# Patient Record
Sex: Male | Born: 1980 | Race: Black or African American | Hispanic: No | Marital: Single | Smoking: Current every day smoker
Health system: Southern US, Community
[De-identification: ages and names within clinical notes are randomized; demographics above are authoritative.]

## PROBLEM LIST (undated history)

## (undated) DIAGNOSIS — J189 Pneumonia, unspecified organism: Secondary | ICD-10-CM

## (undated) DIAGNOSIS — J45909 Unspecified asthma, uncomplicated: Secondary | ICD-10-CM

---
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
## 2012-03-05 ENCOUNTER — Emergency Department (HOSPITAL_COMMUNITY): Payer: Self-pay

## 2012-03-05 ENCOUNTER — Encounter (HOSPITAL_COMMUNITY): Payer: Self-pay | Admitting: *Deleted

## 2012-03-05 ENCOUNTER — Emergency Department (HOSPITAL_COMMUNITY)
Admission: EM | Admit: 2012-03-05 | Discharge: 2012-03-05 | Disposition: A | Discharge status: Home / Self Care | Payer: Self-pay | Attending: Emergency Medicine | Admitting: Emergency Medicine

## 2012-03-05 DIAGNOSIS — F17200 Nicotine dependence, unspecified, uncomplicated: Secondary | ICD-10-CM | POA: Insufficient documentation

## 2012-03-05 DIAGNOSIS — R05 Cough: Secondary | ICD-10-CM | POA: Insufficient documentation

## 2012-03-05 DIAGNOSIS — R059 Cough, unspecified: Secondary | ICD-10-CM | POA: Insufficient documentation

## 2012-03-05 DIAGNOSIS — Z8701 Personal history of pneumonia (recurrent): Secondary | ICD-10-CM | POA: Insufficient documentation

## 2012-03-05 DIAGNOSIS — R51 Headache: Secondary | ICD-10-CM | POA: Insufficient documentation

## 2012-03-05 DIAGNOSIS — J329 Chronic sinusitis, unspecified: Secondary | ICD-10-CM | POA: Insufficient documentation

## 2012-03-05 DIAGNOSIS — J3489 Other specified disorders of nose and nasal sinuses: Secondary | ICD-10-CM | POA: Insufficient documentation

## 2012-03-05 HISTORY — DX: Pneumonia, unspecified organism: J18.9

## 2012-03-05 LAB — RAPID STREP SCREEN (MED CTR MEBANE ONLY): Streptococcus, Group A Screen (Direct): NEGATIVE

## 2012-03-05 MED ORDER — DEXTROMETHORPHAN HBR 15 MG/5ML PO SYRP: 10.0000 mL | ORAL_SOLUTION | Freq: Four times a day (QID) | ORAL | Status: AC | PRN

## 2012-03-05 MED ORDER — AZITHROMYCIN 250 MG PO TABS: 250.0000 mg | ORAL_TABLET | Freq: Every day | ORAL | Status: DC

## 2012-03-05 NOTE — ED Notes (Signed)
Pt reports sorethroat and headache too

## 2012-03-05 NOTE — ED Notes (Signed)
Pt is here with temp of 103 and took tylenol 2 hours ago.  Pt with cough.  No vomiting or diarrhea

## 2012-03-05 NOTE — ED Provider Notes (Signed)
History     CSN: [id number]  Arrival date & time 03/05/12  1335   First MD Initiated Contact with Patient 03/05/12 1347      Chief Complaint  Patient presents with  . Fever  . Cough  . Headache    (Consider location/radiation/quality/duration/timing/severity/associated sxs/prior treatment) HPI Comments: Patient is a 32 year old male who presents with a 3 day history of multiple symptoms including cough, fever of 103, and body aches. Symptoms started gradually and progressively worsened since the onset. Symptoms are constant. Patient tried tylenol for symptoms with some relief of fever. No other associated symptoms. No known sick contacts. No aggravating/alleviating factors.    Past Medical History  Diagnosis Date  . Pneumonia     History reviewed. No pertinent past surgical history.  No family history on file.  History  Substance Use Topics  . Smoking status: Current Every Day Smoker  . Smokeless tobacco: Not on file  . Alcohol Use: Yes      Review of Systems  HENT: Positive for congestion.   Respiratory: Positive for cough.   All other systems reviewed and are negative.    Allergies  Review of patient's allergies indicates no known allergies.  Home Medications  No current outpatient prescriptions on file.  BP 124/63  Pulse 106  Temp 100.8 F (38.2 C) (Oral)  Resp 20  Physical Exam  Nursing note and vitals reviewed. Constitutional: He is oriented to person, place, and time. He appears well-developed and well-nourished. No distress.       Patient coughing throughout interview.   HENT:  Head: Normocephalic and atraumatic.  Mouth/Throat: Oropharynx is clear and moist. No oropharyngeal exudate.       Maxillary sinus tenderness to palpation.   Eyes: Conjunctivae normal are normal.  Neck: Normal range of motion.  Cardiovascular: Normal rate and regular rhythm.  Exam reveals no gallop and no friction rub.   No murmur heard. Pulmonary/Chest: Effort normal  and breath sounds normal. He has no wheezes. He has no rales. He exhibits no tenderness.  Abdominal: Soft. There is no tenderness.  Musculoskeletal: Normal range of motion.  Neurological: He is alert and oriented to person, place, and time.       Speech is goal-oriented. Moves limbs without ataxia.   Skin: Skin is warm and dry.  Psychiatric: He has a normal mood and affect. His behavior is normal.    ED Course  Procedures (including critical care time)   Labs Reviewed  RAPID STREP SCREEN   Dg Chest 2 View  03/05/2012  *RADIOLOGY REPORT*  Clinical Data: Fever.  Cough.  Headache.  Smoker.  CHEST - 2 VIEW  Comparison: None.  Findings: Minimal pectus excavatum deformity. Midline trachea. Normal heart size and mediastinal contours.  Apparent vague right perihilar increased density is favored to be secondary to a pectus deformity.  Clear lungs.  IMPRESSION: No acute cardiopulmonary disease.   Original Report Authenticated By: Jeronimo Greaves, M.D.      1. Sinusitis       MDM  2:08 PM Chest xray pending. Patient will have dextromethorphan for cough.   2:46 PM Chest xray negative and strep test negative. Patient will be treated as sinusitis. No further evaluation needed at this time.       Emilia Beck, [place] 03/06/12 [phone]

## 2012-03-05 NOTE — ED Notes (Signed)
Pt having intermittent coughing, mild sore throat since last night. Pt states tylenol has made pt feel better. denies n/v

## 2012-03-07 NOTE — ED Provider Notes (Signed)
Medical screening examination/treatment/procedure(s) were performed by non-physician practitioner and as supervising physician I was immediately available for consultation/collaboration.   Ayla Dunigan W Kenniyah Sasaki, MD 03/07/12 0102 

## 2013-04-22 ENCOUNTER — Other Ambulatory Visit: Payer: Self-pay | Admitting: Occupational Medicine

## 2013-04-22 ENCOUNTER — Ambulatory Visit: Payer: Self-pay

## 2013-04-22 DIAGNOSIS — Z0000 Encounter for general adult medical examination without abnormal findings: Secondary | ICD-10-CM

## 2019-02-26 ENCOUNTER — Emergency Department (HOSPITAL_COMMUNITY)
Admission: EM | Admit: 2019-02-26 | Discharge: 2019-02-26 | Disposition: A | Discharge status: Home / Self Care | Payer: Self-pay | Attending: Emergency Medicine | Admitting: Emergency Medicine

## 2019-02-26 ENCOUNTER — Other Ambulatory Visit: Payer: Self-pay

## 2019-02-26 ENCOUNTER — Encounter (HOSPITAL_COMMUNITY): Payer: Self-pay

## 2019-02-26 DIAGNOSIS — Z202 Contact with and (suspected) exposure to infections with a predominantly sexual mode of transmission: Secondary | ICD-10-CM

## 2019-02-26 DIAGNOSIS — F17210 Nicotine dependence, cigarettes, uncomplicated: Secondary | ICD-10-CM | POA: Insufficient documentation

## 2019-02-26 MED ORDER — DOXYCYCLINE HYCLATE 100 MG PO CAPS: 100.0000 mg | ORAL_CAPSULE | Freq: Two times a day (BID) | ORAL | 0 refills | Status: DC

## 2019-02-26 MED ORDER — DOXYCYCLINE HYCLATE 100 MG PO CAPS: 100.0000 mg | ORAL_CAPSULE | Freq: Two times a day (BID) | ORAL | 0 refills | Status: AC

## 2019-02-26 NOTE — ED Provider Notes (Signed)
[facility] DEPT Provider Note   CSN: [id number] Arrival date & time: 02/26/19  5638     History Chief Complaint  Patient presents with  . Exposure to STD    Justin Buchanan is a 38 y.o. male with no significant past medical history who presents to the emergency department with a chief complaint of chlamydia exposure.  The patient reports that he is sexually active with 1 male partner.  They do not use condoms.  He reports that she was recently seen by OB/GYN after a miscarriage.  She had a pelvic exam performed and tested positive for chlamydia.  He reports that none of her other STI test were positive.  He was advised to seek testing and treatment based on her positive test result.  He is having no symptoms and denies fever, chills, rashes, abdominal pain, penile or testicular pain or swelling, penile discharge, nausea, vomiting, diarrhea.  No treatment prior to arrival.  No history of STIs.  The history is provided by the patient. No language interpreter was used.       Past Medical History:  Diagnosis Date  . Pneumonia     There are no problems to display for this patient.   History reviewed. No pertinent surgical history.     No family history on file.  Social History   Tobacco Use  . Smoking status: Current Every Day Smoker  Substance Use Topics  . Alcohol use: Yes  . Drug use: No    Home Medications Prior to Admission medications   Medication Sig Start Date End Date Taking? Authorizing Provider  azithromycin (ZITHROMAX Z-PAK) 250 MG tablet Take 1 tablet (250 mg total) by mouth daily. 500mg  PO day 1, then 250mg  PO days 205 03/05/12   Alvina Chou, PA-C  dextromethorphan 15 MG/5ML syrup Take 10 mLs (30 mg total) by mouth 4 (four) times daily as needed for cough. 03/05/12   Alvina Chou, PA-C  doxycycline (VIBRAMYCIN) 100 MG capsule Take 1 capsule (100 mg total) by mouth 2 (two) times daily for 7 days. 02/26/19 03/05/19   Deetta Siegmann A, PA-C    Allergies    Patient has no known allergies.  Review of Systems   Review of Systems  Constitutional: Negative for appetite change and fever.  Respiratory: Negative for shortness of breath.   Cardiovascular: Negative for chest pain.  Gastrointestinal: Negative for abdominal pain.  Genitourinary: Negative for discharge, dysuria, flank pain, genital sores, penile pain, penile swelling, scrotal swelling and urgency.  Musculoskeletal: Negative for back pain.  Skin: Negative for rash.  Allergic/Immunologic: Negative for immunocompromised state.  Neurological: Negative for headaches.  Psychiatric/Behavioral: Negative for confusion.    Physical Exam Updated Vital Signs BP (!) 136/99 (BP Location: Right Arm)   Pulse 70   Temp 97.6 F (36.4 C) (Oral)   Resp 17   Ht 5\' 8"  (1.727 m)   Wt 79.4 kg   SpO2 97%   BMI 26.61 kg/m   Physical Exam Vitals and nursing note reviewed.  Constitutional:      Appearance: He is well-developed. He is not toxic-appearing or diaphoretic.  HENT:     Head: Normocephalic.  Eyes:     Conjunctiva/sclera: Conjunctivae normal.  Cardiovascular:     Rate and Rhythm: Normal rate and regular rhythm.     Heart sounds: No murmur.  Pulmonary:     Effort: Pulmonary effort is normal. No respiratory distress.     Breath sounds: No stridor. No wheezing, rhonchi or  rales.  Chest:     Chest wall: No tenderness.  Abdominal:     General: There is no distension.     Palpations: Abdomen is soft. There is no mass.     Tenderness: There is no abdominal tenderness. There is no right CVA tenderness, left CVA tenderness, guarding or rebound.     Hernia: No hernia is present.  Musculoskeletal:     Cervical back: Neck supple.  Skin:    General: Skin is warm and dry.  Neurological:     Mental Status: He is alert.  Psychiatric:        Behavior: Behavior normal.     ED Results / Procedures / Treatments   Labs (all labs ordered are listed,  but only abnormal results are displayed) Labs Reviewed - No data to display  EKG None  Radiology No results found.  Procedures Procedures (including critical care time)  Medications Ordered in ED Medications - No data to display  ED Course  I have reviewed the triage vital signs and the nursing notes.  Pertinent labs & imaging results that were available during my care of the patient were reviewed by me and considered in my medical decision making (see chart for details).    MDM Rules/Calculators/A&P                      38 year old male presenting to the ER after a known chlamydia exposure from his 1 male sexual partner when she was found to be positive for chlamydia after having a miscarriage.  He has been asymptomatic.  He is well-appearing and in no acute distress.  Shared decision-making conversation with the patient.  Discussed treatment versus testing and treatment.  The patient would prefer to just be treated given his exposure.  He was also offered HIV and syphilis testing at this time, which he declined.  He was discharged with a 7-day course of doxycycline given the new CDC treatment recommendations as of December 2020.  He was advised to follow-up with the [facility] department for regular STI screening.  He has also been advised to avoid sexual intercourse for 7 days after he finishes his 7-day course of doxycycline.  All questions answered.  Doubt prostatitis or intra-abdominal infection at this time.  He is hemodynamically stable and in no acute distress.  Safe for discharge home with outpatient follow-up as needed.  Final Clinical Impression(s) / ED Diagnoses Final diagnoses:  Exposure to chlamydia    Rx / DC Orders ED Discharge Orders         Ordered    doxycycline (VIBRAMYCIN) 100 MG capsule  2 times daily,   Status:  Discontinued     02/26/19 0551    doxycycline (VIBRAMYCIN) 100 MG capsule  2 times daily     02/26/19 0557           Barkley Boards, PA-C 02/26/19 0612    Marily Memos, MD 02/26/19 [phone]

## 2019-02-26 NOTE — Discharge Instructions (Addendum)
Thank you for allowing me to care for you today in the Emergency Department.   You are being treated for chlamydia after an exposure.  Take 1 tablet of doxycycline 2 times daily for the next week.  It is very important you finish the entire course of antibiotics to prevent antibiotic resistance and to completely treat the infection.  You should abstain from all forms of sexual activity for 7 days after you finish taking the antibiotics.  That would be 14 days from today.  You can follow-up with [facility] for regular STD screening.  You can also follow-up for syphilis or HIV testing, which he declined today.  Wearing a condom can prevent sexually transmitted infections.  Return to the emergency department if you develop severe pain or swelling in your penis or testicles, high fevers, uncontrollable vomiting, or severe abdominal pain, particularly after you finish taking the entire course of antibiotics.

## 2019-02-26 NOTE — ED Triage Notes (Signed)
Pt coming from home c/o exposure to std. Partner tested positive for chlamydia yesterday. No symptoms

## 2019-04-19 ENCOUNTER — Emergency Department (HOSPITAL_COMMUNITY)
Admission: EM | Admit: 2019-04-19 | Discharge: 2019-04-19 | Disposition: A | Discharge status: Home / Self Care | Payer: Self-pay | Attending: Emergency Medicine | Admitting: Emergency Medicine

## 2019-04-19 ENCOUNTER — Other Ambulatory Visit: Payer: Self-pay

## 2019-04-19 DIAGNOSIS — F17210 Nicotine dependence, cigarettes, uncomplicated: Secondary | ICD-10-CM | POA: Insufficient documentation

## 2019-04-19 DIAGNOSIS — N489 Disorder of penis, unspecified: Secondary | ICD-10-CM

## 2019-04-19 DIAGNOSIS — Z202 Contact with and (suspected) exposure to infections with a predominantly sexual mode of transmission: Secondary | ICD-10-CM | POA: Insufficient documentation

## 2019-04-19 DIAGNOSIS — N509 Disorder of male genital organs, unspecified: Secondary | ICD-10-CM | POA: Insufficient documentation

## 2019-04-19 DIAGNOSIS — Z711 Person with feared health complaint in whom no diagnosis is made: Secondary | ICD-10-CM

## 2019-04-19 LAB — URINALYSIS, ROUTINE W REFLEX MICROSCOPIC
Bilirubin Urine: NEGATIVE
Glucose, UA: NEGATIVE mg/dL
Hgb urine dipstick: NEGATIVE
Ketones, ur: NEGATIVE mg/dL
Leukocytes,Ua: NEGATIVE
Nitrite: NEGATIVE
Protein, ur: NEGATIVE mg/dL
Specific Gravity, Urine: 1.02 (ref 1.005–1.030)
pH: 7 (ref 5.0–8.0)

## 2019-04-19 MED ORDER — LIDOCAINE HCL (PF) 1 % IJ SOLN
2.0000 mL | Freq: Once | INTRAMUSCULAR | Status: AC
  Administered 2019-04-19: 12:00:00 2 mL
  Filled 2019-04-19: qty 30

## 2019-04-19 MED ORDER — DOXYCYCLINE HYCLATE 100 MG PO TABS
100.0000 mg | ORAL_TABLET | Freq: Once | ORAL | Status: AC
Start: 2019-04-19 — End: 2019-04-19
  Administered 2019-04-19: 12:00:00 100 mg via ORAL
  Filled 2019-04-19: qty 1

## 2019-04-19 MED ORDER — DOXYCYCLINE HYCLATE 100 MG PO CAPS: 100.0000 mg | ORAL_CAPSULE | Freq: Two times a day (BID) | ORAL | 0 refills | Status: AC

## 2019-04-19 MED ORDER — CEFTRIAXONE SODIUM 1 G IJ SOLR
500.0000 mg | Freq: Once | INTRAMUSCULAR | Status: AC
  Administered 2019-04-19: 500 mg via INTRAMUSCULAR
  Filled 2019-04-19: qty 10

## 2019-04-19 NOTE — Discharge Instructions (Signed)
Labs are pending. You will need to seek reevaluation if these are positive

## 2019-04-19 NOTE — ED Triage Notes (Signed)
Patient states he may have STD, patient states he has a scab formed on head of penis. States it was a reddened area on Tuesday, now it has scabbed over. Patient states area is not painful.

## 2019-04-19 NOTE — ED Provider Notes (Signed)
[facility]-EMERGENCY DEPT Provider Note   CSN: [id number] Arrival date & time: 04/19/19  1050   History Chief Complaint  Patient presents with  . Exposure to STD    scab on top of penis   Justin Buchanan is a 39 y.o. male with past medical history significant for chlamydia who presents for evaluation of possible STD.  Patient states he has noted to nonpainful lesions to his glans penis.  Patient states that there was a scab to those which came off and then reappeared.  Has prior history of chlamydia.  Has also noticed some mild clear drainage from his penis.  He is sexually active with 2 females.  Denies fever, chills, nausea, vomiting, abdominal pain, dysuria, pain with bowel movements, testicular pain, swelling.  Denies additional aggravating or alleviating factors.  Pain currently 0/10.  History obtained from patient and past medical records.  No interpreter is used.     Exposure to STD       Past Medical History:  Diagnosis Date  . Pneumonia     There are no problems to display for this patient.   No past surgical history on file.     No family history on file.  Social History   Tobacco Use  . Smoking status: Current Every Day Smoker  Substance Use Topics  . Alcohol use: Yes  . Drug use: No    Home Medications Prior to Admission medications   Medication Sig Start Date End Date Taking? Authorizing Provider  azithromycin (ZITHROMAX Z-PAK) 250 MG tablet Take 1 tablet (250 mg total) by mouth daily. 500mg  PO day 1, then 250mg  PO days 205 03/05/12   , PA-C  dextromethorphan 15 MG/5ML syrup Take 10 mLs (30 mg total) by mouth 4 (four) times daily as needed for cough. 03/05/12   Emilia Beck, PA-C    Allergies    Patient has no known allergies.  Review of Systems   Review of Systems  Constitutional: Negative.   HENT: Negative.   Respiratory: Negative.   Cardiovascular: Negative.   Gastrointestinal: Negative.     Genitourinary: Positive for discharge and genital sores. Negative for decreased urine volume, difficulty urinating, flank pain, frequency, hematuria, penile pain, penile swelling, scrotal swelling, testicular pain and urgency.  Musculoskeletal: Negative.   Skin: Negative.   All other systems reviewed and are negative.   Physical Exam Updated Vital Signs BP (!) 152/97 (BP Location: Right Arm)   Pulse 79   Temp 98 F (36.7 C) (Oral)   Resp 17   Ht 5\' 8"  (1.727 m)   Wt 79.4 kg   SpO2 100%   BMI 26.61 kg/m   Physical Exam Vitals and nursing note reviewed. Exam conducted with a chaperone present.  Constitutional:      General: He is not in acute distress.    Appearance: He is well-developed. He is not ill-appearing, toxic-appearing or diaphoretic.  HENT:     Head: Normocephalic and atraumatic.     Mouth/Throat:     Mouth: Mucous membranes are moist.     Pharynx: Oropharynx is clear.  Eyes:     Pupils: Pupils are equal, round, and reactive to light.  Cardiovascular:     Rate and Rhythm: Normal rate and regular rhythm.  Pulmonary:     Effort: Pulmonary effort is normal. No respiratory distress.  Abdominal:     General: Bowel sounds are normal. There is no distension.     Palpations: Abdomen is soft.  Genitourinary:  Penis: Lesions present. No phimosis, paraphimosis, hypospadias, erythema, tenderness, discharge or swelling.      Testes: Normal. Cremasteric reflex is present.     Epididymis:     Right: Normal.     Left: Normal.     Comments: Crusting discharge to urethral meatus.  There is a 3 mm circular nonulcerative lesion to the left glans penis.  Nontender palpation.  Regular borders.  No erythema, warmth.  There is scab to this area.  No bleeding or drainage Musculoskeletal:        General: Normal range of motion.     Cervical back: Normal range of motion and neck supple.  Skin:    General: Skin is warm and dry.     Comments: No edema, erythema or warmth.   Neurological:     Mental Status: He is alert.     ED Results / Procedures / Treatments   Labs (all labs ordered are listed, but only abnormal results are displayed) Labs Reviewed  URINE CULTURE  RPR  URINALYSIS, ROUTINE W REFLEX MICROSCOPIC  HIV ANTIBODY (ROUTINE TESTING W REFLEX)  GC/CHLAMYDIA PROBE AMP ([place]) NOT AT [facility]    EKG None  Radiology No results found.  Procedures Procedures (including critical care time)  Medications Ordered in ED Medications  cefTRIAXone (ROCEPHIN) injection 500 mg (500 mg Intramuscular Given 04/19/19 1226)  doxycycline (VIBRA-TABS) tablet 100 mg (100 mg Oral Given 04/19/19 1225)  lidocaine (PF) (XYLOCAINE) 1 % injection 2 mL (2 mLs Other Given 04/19/19 1226)    ED Course  I have reviewed the triage vital signs and the nursing notes.  Pertinent labs & imaging results that were available during my care of the patient were reviewed by me and considered in my medical decision making (see chart for details).  Patient is afebrile without abdominal tenderness, abdominal pain or painful bowel movements to indicate prostatitis.  No tenderness to palpation of the testes or epididymis to suggest orchitis or epididymitis.  STD cultures obtained including HIV, syphilis, gonorrhea and chlamydia.  Patient does have 3 mm nonulcerative lesion which is crusted to his left glans penis.  No bleeding or drainage.  He also has some crusting discharge to his urethral meatus.  Wound does not appear to be abscess or cellulitis. Lesions doe snot appear to be HSV.  Patient to be discharged with instructions to follow up with PCP. Discussed importance of using protection when sexually active. Pt understands that they have GC/Chlamydia cultures pending and that they will need to inform all sexual partners if results return positive.  Patient treated with empiric Rocephin and doxycycline.  He prefers to hold on RPR labs for treatment of this.  He will return if these are  positive.  The patient has been appropriately medically screened and/or stabilized in the ED. I have low suspicion for any other emergent medical condition which would require further screening, evaluation or treatment in the ED or require inpatient management.  Patient is hemodynamically stable and in no acute distress.  Patient able to ambulate in department prior to ED.  Evaluation does not show acute pathology that would require ongoing or additional emergent interventions while in the emergency department or further inpatient treatment.  I have discussed the diagnosis with the patient and answered all questions.  Pain is been managed while in the emergency department and patient has no further complaints prior to discharge.  Patient is comfortable with plan discussed in room and is stable for discharge at this time.  I have discussed strict return precautions for returning to the emergency department.  Patient was encouraged to follow-up with PCP/specialist refer to at discharge.     MDM Rules/Calculators/A&P                       Final Clinical Impression(s) / ED Diagnoses Final diagnoses:  Concern about STD in male without diagnosis  Penile lesion    Rx / DC Orders ED Discharge Orders    None       Blessyn Sommerville A, PA-C 04/19/19 1245    Daleen Bo, MD 04/19/19 1539

## 2019-04-20 LAB — URINE CULTURE: Culture: NO GROWTH

## 2019-04-20 LAB — RPR: RPR Ser Ql: NONREACTIVE

## 2019-04-20 LAB — HIV ANTIBODY (ROUTINE TESTING W REFLEX): HIV Screen 4th Generation wRfx: NONREACTIVE — AB

## 2019-05-12 ENCOUNTER — Encounter (HOSPITAL_COMMUNITY): Payer: Self-pay | Admitting: Emergency Medicine

## 2019-05-12 ENCOUNTER — Emergency Department (HOSPITAL_COMMUNITY)
Admission: EM | Admit: 2019-05-12 | Discharge: 2019-05-12 | Disposition: A | Discharge status: Home / Self Care | Payer: Self-pay | Attending: Emergency Medicine | Admitting: Emergency Medicine

## 2019-05-12 ENCOUNTER — Other Ambulatory Visit: Payer: Self-pay

## 2019-05-12 DIAGNOSIS — Z21 Asymptomatic human immunodeficiency virus [HIV] infection status: Secondary | ICD-10-CM | POA: Insufficient documentation

## 2019-05-12 DIAGNOSIS — F17210 Nicotine dependence, cigarettes, uncomplicated: Secondary | ICD-10-CM | POA: Insufficient documentation

## 2019-05-12 DIAGNOSIS — R21 Rash and other nonspecific skin eruption: Secondary | ICD-10-CM

## 2019-05-12 NOTE — ED Triage Notes (Signed)
Patient reports "lesions" to penis. States treated for STD x1 month ago with little change in wounds.

## 2019-05-12 NOTE — ED Notes (Signed)
Pt verbalizes understanding of DC instructions. Pt belongings returned and is ambulatory out of ED.  

## 2019-05-12 NOTE — ED Provider Notes (Signed)
[name] DEPT Provider Note   CSN: [id number] Arrival date & time: 05/12/19  2011     History Chief Complaint  Patient presents with  . Rash    Justin Buchanan is a 39 y.o. male.  39 year old male who presents with 3 weeks of penile rash.  Was seen here and treated for STD.  Review the chart shows that he had a negative RPR.  He denies any dysuria or penile drainage or discharge.  No pain in his groin.  No testicular discomfort.  Has been using topical ointments without relief.        Past Medical History:  Diagnosis Date  . Pneumonia     There are no problems to display for this patient.   History reviewed. No pertinent surgical history.     No family history on file.  Social History   Tobacco Use  . Smoking status: Current Every Day Smoker  Substance Use Topics  . Alcohol use: Yes  . Drug use: No    Home Medications Prior to Admission medications   Medication Sig Start Date End Date Taking? Authorizing Provider  azithromycin (ZITHROMAX Z-PAK) 250 MG tablet Take 1 tablet (250 mg total) by mouth daily. 500mg  PO day 1, then 250mg  PO days 205 03/05/12   Alvina Chou, PA-C  dextromethorphan 15 MG/5ML syrup Take 10 mLs (30 mg total) by mouth 4 (four) times daily as needed for cough. 03/05/12   Alvina Chou, PA-C    Allergies    Patient has no known allergies.  Review of Systems   Review of Systems  All other systems reviewed and are negative.   Physical Exam Updated Vital Signs BP (!) 160/106   Pulse (!) 102   Temp 98.2 F (36.8 C) (Oral)   Resp 19   SpO2 98%   Physical Exam Vitals and nursing note reviewed.  Constitutional:      General: He is not in acute distress.    Appearance: Normal appearance. He is well-developed. He is not toxic-appearing.  HENT:     Head: Normocephalic and atraumatic.  Eyes:     General: Lids are normal.     Conjunctiva/sclera: Conjunctivae normal.     Pupils: Pupils are  equal, round, and reactive to light.  Neck:     Thyroid: No thyroid mass.     Trachea: No tracheal deviation.  Cardiovascular:     Rate and Rhythm: Normal rate and regular rhythm.     Heart sounds: Normal heart sounds. No murmur. No gallop.   Pulmonary:     Effort: Pulmonary effort is normal. No respiratory distress.     Breath sounds: Normal breath sounds. No stridor. No decreased breath sounds, wheezing, rhonchi or rales.  Abdominal:     General: Bowel sounds are normal. There is no distension.     Palpations: Abdomen is soft.     Tenderness: There is no abdominal tenderness. There is no rebound.  Genitourinary:    Penis: Circumcised. Lesions present. No erythema or discharge.      Testes:        Right: Mass not present.        Left: Mass not present.     Epididymis:     Right: Normal.     Left: Normal.     Comments: Dry skin noted and plaques to penis.  No vesicles noted.  No erythema. Musculoskeletal:        General: No tenderness. Normal range of motion.  Cervical back: Normal range of motion and neck supple.  Lymphadenopathy:     Lower Body: No right inguinal adenopathy. No left inguinal adenopathy.  Skin:    General: Skin is warm and dry.     Findings: No abrasion or rash.  Neurological:     Mental Status: He is alert and oriented to person, place, and time.     GCS: GCS eye subscore is 4. GCS verbal subscore is 5. GCS motor subscore is 6.     Cranial Nerves: No cranial nerve deficit.     Sensory: No sensory deficit.  Psychiatric:        Speech: Speech normal.        Behavior: Behavior normal.     ED Results / Procedures / Treatments   Labs (all labs ordered are listed, but only abnormal results are displayed) Labs Reviewed - No data to display  EKG None  Radiology No results found.  Procedures Procedures (including critical care time)  Medications Ordered in ED Medications - No data to display  ED Course  I have reviewed the triage vital signs  and the nursing notes.  Pertinent labs & imaging results that were available during my care of the patient were reviewed by me and considered in my medical decision making (see chart for details).    MDM Rules/Calculators/A&P                     Patient with 3 weeks of penile rash of unclear etiology.  RPR negative.  Suspect possible contact dermatitis. We will give referral to urology on-call. Final Clinical Impression(s) / ED Diagnoses Final diagnoses:  None    Rx / DC Orders ED Discharge Orders    None       Lorre Nick, MD 05/12/19 2052

## 2019-06-03 ENCOUNTER — Encounter (HOSPITAL_COMMUNITY): Payer: Self-pay

## 2019-06-03 ENCOUNTER — Ambulatory Visit (HOSPITAL_COMMUNITY)
Admission: EM | Admit: 2019-06-03 | Discharge: 2019-06-03 | Disposition: A | Discharge status: Home / Self Care | Payer: Self-pay | Attending: Internal Medicine | Admitting: Internal Medicine

## 2019-06-03 ENCOUNTER — Other Ambulatory Visit: Payer: Self-pay

## 2019-06-03 DIAGNOSIS — H1013 Acute atopic conjunctivitis, bilateral: Secondary | ICD-10-CM

## 2019-06-03 DIAGNOSIS — H109 Unspecified conjunctivitis: Secondary | ICD-10-CM

## 2019-06-03 HISTORY — DX: Unspecified asthma, uncomplicated: J45.909

## 2019-06-03 MED ORDER — ERYTHROMYCIN 5 MG/GM OP OINT: TOPICAL_OINTMENT | OPHTHALMIC | 0 refills | Status: AC

## 2019-06-03 MED ORDER — OLOPATADINE HCL 0.1 % OP SOLN: 1.0000 [drp] | Freq: Two times a day (BID) | OPHTHALMIC | 12 refills | Status: AC

## 2019-06-03 NOTE — ED Triage Notes (Signed)
C/o eye irritation. Reports this is day three. Woke up this morning and his eyes were stuck together and crusty.

## 2019-06-04 NOTE — ED Provider Notes (Signed)
[facility]    CSN: [id number] Arrival date & time: 06/03/19  1539      History   Chief Complaint Chief Complaint  Patient presents with  . Eye Problem    HPI Justin Buchanan is a 39 y.o. male comes to urgent care with complaints of redness and itching in both eyes of 3 days duration.  Patient has a history of asthma but denies any history of allergic conjunctivitis.  He denies any pain in the eyes.  No swelling of the eyelids.  Patient woke up this morning with the eyelids stuck shut.  He had some yellowish drainage from both eyes.  No nasal itching or throat itching.  No fever or chills.[name]   HPI  Past Medical History:  Diagnosis Date  . Asthma    childhood  . Pneumonia     There are no problems to display for this patient.   History reviewed. No pertinent surgical history.     Home Medications    Prior to Admission medications   Medication Sig Start Date End Date Taking? Authorizing Provider  dextromethorphan 15 MG/5ML syrup Take 10 mLs (30 mg total) by mouth 4 (four) times daily as needed for cough. 03/05/12   Emilia Beck, PA-C  erythromycin ophthalmic ointment Place a 1/2 inch ribbon of ointment into the lower eyelid. 06/03/19   Fouad Taul, Britta Mccreedy, MD  olopatadine (PATANOL) 0.1 % ophthalmic solution Place 1 drop into both eyes 2 (two) times daily. 06/03/19   Adasyn Mcadams, Britta Mccreedy, MD    Family History No family history on file.  Social History Social History   Tobacco Use  . Smoking status: Current Every Day Smoker    Packs/day: 0.50    Types: Cigarettes  . Smokeless tobacco: Never Used  Substance Use Topics  . Alcohol use: Yes  . Drug use: No     Allergies   Patient has no known allergies.   Review of Systems Review of Systems  Constitutional: Negative for activity change.  HENT: Positive for congestion. Negative for ear discharge, facial swelling, sinus pressure, sinus pain, sore throat and voice change.   Eyes: Positive for  discharge, redness and itching. Negative for photophobia, pain and visual disturbance.  Respiratory: Negative.   Cardiovascular: Negative.   Gastrointestinal: Negative.   Musculoskeletal: Negative.      Physical Exam Triage Vital Signs ED Triage Vitals  Enc Vitals Group     BP 06/03/19 1605 133/89     Pulse Rate 06/03/19 1605 86     Resp 06/03/19 1605 15     Temp 06/03/19 1605 98.7 F (37.1 C)     Temp Source 06/03/19 1605 Oral     SpO2 06/03/19 1605 100 %     Weight --      Height --      Head Circumference --      Peak Flow --      Pain Score 06/03/19 1603 7     Pain Loc --      Pain Edu? --      Excl. in GC? --    No data found.  Updated Vital Signs BP 133/89 (BP Location: Right Arm)   Pulse 86   Temp 98.7 F (37.1 C) (Oral)   Resp 15   SpO2 100%   Visual Acuity Right Eye Distance:   Left Eye Distance:   Bilateral Distance:    Right Eye Near:   Left Eye Near:    Bilateral Near:  Physical Exam Vitals and nursing note reviewed.  Constitutional:      Appearance: Normal appearance.  HENT:     Right Ear: Tympanic membrane normal.     Left Ear: Tympanic membrane normal.     Nose: Nose normal. No congestion or rhinorrhea.     Mouth/Throat:     Mouth: Mucous membranes are moist.     Pharynx: No oropharyngeal exudate or posterior oropharyngeal erythema.  Eyes:     General:        Right eye: Discharge present.        Left eye: Discharge present.    Extraocular Movements: Extraocular movements intact.     Comments: Bilateral conjunctival erythema.  Purulent discharge noted the medial aspect of the right eye.  Cardiovascular:     Pulses: Normal pulses.     Heart sounds: Normal heart sounds.  Pulmonary:     Effort: Pulmonary effort is normal. No respiratory distress.     Breath sounds: Normal breath sounds. No rhonchi or rales.  Abdominal:     General: Bowel sounds are normal. There is no distension.     Tenderness: There is no guarding or rebound.    Musculoskeletal:        General: No swelling or tenderness. Normal range of motion.     Cervical back: Normal range of motion.  Neurological:     Mental Status: He is alert.      UC Treatments / Results  Labs (all labs ordered are listed, but only abnormal results are displayed) Labs Reviewed - No data to display  EKG   Radiology No results found.  Procedures Procedures (including critical care time)  Medications Ordered in UC Medications - No data to display  Initial Impression / Assessment and Plan / UC Course  I have reviewed the triage vital signs and the nursing notes.  Pertinent labs & imaging results that were available during my care of the patient were reviewed by me and considered in my medical decision making (see chart for details).    1.  Bilateral allergic conjunctivitis with superimposed bacterial conjunctivitis: Patanol ophthalmic solution 1 drop each eye twice daily Erythromycin ophthalmic ointment nightly for 3 days Return precautions given If patient develops blurry vision, photophobia or severe pain on moving the eyeball-is recommended he calls ophthalmology office immediately to be evaluated.   Final Clinical Impressions(s) / UC Diagnoses   Final diagnoses:  Allergic conjunctivitis of both eyes  Bacterial conjunctivitis of both eyes   Discharge Instructions   None    ED Prescriptions    Medication Sig Dispense Auth. Provider   olopatadine (PATANOL) 0.1 % ophthalmic solution Place 1 drop into both eyes 2 (two) times daily. 5 mL Chasidy Janak, Myrene Galas, MD   erythromycin ophthalmic ointment Place a 1/2 inch ribbon of ointment into the lower eyelid. 3.5 g Anajulia Leyendecker, Myrene Galas, MD     PDMP not reviewed this encounter.   Chase Picket, MD 06/04/19 [phone]

## 2019-07-16 ENCOUNTER — Other Ambulatory Visit: Payer: Self-pay

## 2019-07-16 ENCOUNTER — Ambulatory Visit (HOSPITAL_COMMUNITY)
Admission: EM | Admit: 2019-07-16 | Discharge: 2019-07-16 | Disposition: A | Discharge status: Home / Self Care | Payer: Self-pay | Attending: Physician Assistant | Admitting: Physician Assistant

## 2019-07-16 ENCOUNTER — Encounter (HOSPITAL_COMMUNITY): Payer: Self-pay

## 2019-07-16 DIAGNOSIS — H209 Unspecified iridocyclitis: Secondary | ICD-10-CM

## 2019-07-16 MED ORDER — CYCLOPENTOLATE HCL 1 % OP SOLN: 1.0000 [drp] | OPHTHALMIC | 0 refills | Status: AC
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
## 2019-07-16 NOTE — Discharge Instructions (Addendum)
Use the eye drops up to every 5 minutes. Avoid bright lights This will help with pain. You may also take tylenol  Follow up tomorrow at 8am with the Eye doctor, you may call for appointment or walk in to clinic and they will fit you in
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
## 2019-07-16 NOTE — ED Triage Notes (Signed)
Pain in L eye is worse.  Both eyes have redness and watering.

## 2019-11-30 ENCOUNTER — Emergency Department (HOSPITAL_COMMUNITY)
Admission: EM | Admit: 2019-11-30 | Discharge: 2019-11-30 | Disposition: A | Discharge status: Home / Self Care | Payer: Self-pay | Attending: Emergency Medicine | Admitting: Emergency Medicine

## 2019-11-30 ENCOUNTER — Other Ambulatory Visit: Payer: Self-pay

## 2019-11-30 ENCOUNTER — Encounter (HOSPITAL_COMMUNITY): Payer: Self-pay | Admitting: Obstetrics and Gynecology

## 2019-11-30 DIAGNOSIS — F17210 Nicotine dependence, cigarettes, uncomplicated: Secondary | ICD-10-CM | POA: Insufficient documentation

## 2019-11-30 DIAGNOSIS — Z711 Person with feared health complaint in whom no diagnosis is made: Secondary | ICD-10-CM

## 2019-11-30 DIAGNOSIS — Z202 Contact with and (suspected) exposure to infections with a predominantly sexual mode of transmission: Secondary | ICD-10-CM | POA: Insufficient documentation

## 2019-11-30 DIAGNOSIS — J45909 Unspecified asthma, uncomplicated: Secondary | ICD-10-CM | POA: Insufficient documentation

## 2019-11-30 LAB — URINALYSIS, ROUTINE W REFLEX MICROSCOPIC
Bacteria, UA: NONE SEEN
Bilirubin Urine: NEGATIVE
Glucose, UA: NEGATIVE mg/dL
Hgb urine dipstick: NEGATIVE
Ketones, ur: NEGATIVE mg/dL
Nitrite: NEGATIVE
Protein, ur: NEGATIVE mg/dL
Specific Gravity, Urine: 1.025 (ref 1.005–1.030)
WBC, UA: >50 WBC/hpf — ABNORMAL HIGH (ref 0–5)
pH: 6 (ref 5.0–8.0)

## 2019-11-30 LAB — RAPID HIV SCREEN (HIV 1/2 AB+AG)
HIV 1/2 Antibodies: NONREACTIVE
HIV-1 P24 Antigen - HIV24: NONREACTIVE

## 2019-11-30 MED ORDER — CEFTRIAXONE SODIUM 1 G IJ SOLR
500.0000 mg | Freq: Once | INTRAMUSCULAR | Status: AC
  Administered 2019-11-30: 500 mg via INTRAMUSCULAR
  Filled 2019-11-30: qty 10

## 2019-11-30 MED ORDER — DOXYCYCLINE HYCLATE 100 MG PO TABS
100.0000 mg | ORAL_TABLET | Freq: Once | ORAL | Status: AC
  Administered 2019-11-30: 100 mg via ORAL
  Filled 2019-11-30: qty 1

## 2019-11-30 MED ORDER — LIDOCAINE HCL 1 % IJ SOLN
INTRAMUSCULAR | Status: AC
  Administered 2019-11-30: 2.1 mL
  Filled 2019-11-30: qty 20

## 2019-11-30 MED ORDER — DOXYCYCLINE HYCLATE 100 MG PO CAPS: 100.0000 mg | ORAL_CAPSULE | Freq: Two times a day (BID) | ORAL | 0 refills | Status: AC

## 2019-11-30 MED ORDER — METRONIDAZOLE 500 MG PO TABS
2000.0000 mg | ORAL_TABLET | Freq: Once | ORAL | Status: AC
  Administered 2019-11-30: 2000 mg via ORAL
  Filled 2019-11-30: qty 4

## 2019-11-30 NOTE — ED Provider Notes (Signed)
[facility]-EMERGENCY DEPT Provider Note   CSN: [id number] Arrival date & time: 11/30/19  2130     History Chief Complaint  Patient presents with   Exposure to STD    Justin Buchanan is a 39 y.o. male who presents to ED with a chief complaint of penile discharge.  States that he was treated for chlamydia and trichomonas about 6 weeks ago and is concerned for the same.  Reports symptoms have been going on for the past 2 to 3 days with the last unprotected intercourse being about 1 week ago.  No known exposures.  Denies any dysuria, testicle pain or swelling, rashes, abdominal pain or fever.  HPI     Past Medical History:  Diagnosis Date   Asthma    childhood   Pneumonia     There are no problems to display for this patient.   Past Surgical History:  Procedure Laterality Date   CYSTECTOMY  1999       No family history on file.  Social History   Tobacco Use   Smoking status: Current Every Day Smoker    Packs/day: 0.50    Types: Cigarettes   Smokeless tobacco: Never Used  Vaping Use   Vaping Use: Never used  Substance Use Topics   Alcohol use: Yes    Comment: occasional    Drug use: No    Home Medications Prior to Admission medications   Medication Sig Start Date End Date Taking? Authorizing Provider  cyclopentolate (CYCLODRYL,CYCLOGYL) 1 % ophthalmic solution Place 1 drop into the left eye every 5 (five) minutes. 07/16/19   Darr, Veryl Speak, PA-C  dextromethorphan 15 MG/5ML syrup Take 10 mLs (30 mg total) by mouth 4 (four) times daily as needed for cough. 03/05/12   Emilia Beck, PA-C  doxycycline (VIBRAMYCIN) 100 MG capsule Take 1 capsule (100 mg total) by mouth 2 (two) times daily for 7 days. 11/30/19 12/07/19  Reka Wist, PA-C  erythromycin ophthalmic ointment Place a 1/2 inch ribbon of ointment into the lower eyelid. 06/03/19   Lamptey, Britta Mccreedy, MD  olopatadine (PATANOL) 0.1 % ophthalmic solution Place 1 drop into both eyes  2 (two) times daily. 06/03/19   Lamptey, Britta Mccreedy, MD    Allergies    Patient has no known allergies.  Review of Systems   Review of Systems  Constitutional: Negative for chills and fever.  Gastrointestinal: Negative for abdominal pain.  Genitourinary: Positive for discharge. Negative for dysuria, penile pain, penile swelling, scrotal swelling and testicular pain.  Skin: Negative for rash.    Physical Exam Updated Vital Signs BP (!) 138/96 (BP Location: Left Arm)    Pulse 76    Temp 97.9 F (36.6 C) (Oral)    Resp 16    Ht 5\' 8"  (1.727 m)    Wt 81.6 kg    SpO2 100%    BMI 27.37 kg/m   Physical Exam Vitals and nursing note reviewed.  Constitutional:      General: He is not in acute distress.    Appearance: He is well-developed. He is not diaphoretic.  HENT:     Head: Normocephalic and atraumatic.  Eyes:     General: No scleral icterus.    Conjunctiva/sclera: Conjunctivae normal.  Pulmonary:     Effort: Pulmonary effort is normal. No respiratory distress.  Abdominal:     Tenderness: There is no abdominal tenderness.  Genitourinary:    Comments: Patient declined. Musculoskeletal:     Cervical back: Normal range  of motion.  Skin:    Findings: No rash.  Neurological:     Mental Status: He is alert.     ED Results / Procedures / Treatments   Labs (all labs ordered are listed, but only abnormal results are displayed) Labs Reviewed  URINALYSIS, ROUTINE W REFLEX MICROSCOPIC - Abnormal; Notable for the following components:      Result Value   Leukocytes,Ua MODERATE (*)    WBC, UA >50 (*)    All other components within normal limits  RPR  RAPID HIV SCREEN (HIV 1/2 AB+AG)  GC/CHLAMYDIA PROBE AMP ([place]) NOT AT [facility]    EKG None  Radiology No results found.  Procedures Procedures (including critical care time)  Medications Ordered in ED Medications  cefTRIAXone (ROCEPHIN) injection 500 mg (has no administration in time range)  doxycycline (VIBRA-TABS)  tablet 100 mg (has no administration in time range)  metroNIDAZOLE (FLAGYL) tablet 2,000 mg (has no administration in time range)    ED Course  I have reviewed the triage vital signs and the nursing notes.  Pertinent labs & imaging results that were available during my care of the patient were reviewed by me and considered in my medical decision making (see chart for details).    MDM Rules/Calculators/A&P                          39 year old male presenting to the ED for STD check. He declined GU exam but denies any rashes, lesions, testicle pain or abdominal pain.  We will treat prior to results per patient's request.  We will also test for HIV, RPR per his request. UA with leukocytes, pyuria which I feel is 2/2 to STD rather than UTI as he is asymptomatic. Patient advised to follow-up on results, abstain from intercourse for the next 7 days. Patient agreeable to plan.   Patient is hemodynamically stable, in NAD, and able to ambulate in the ED. Evaluation does not show pathology that would require ongoing emergent intervention or inpatient treatment. I explained the diagnosis to the patient. Pain has been managed and has no complaints prior to discharge. Patient is comfortable with above plan and is stable for discharge at this time. All questions were answered prior to disposition. Strict return precautions for returning to the ED were discussed. Encouraged follow up with PCP.   An After Visit Summary was printed and given to the patient.   Portions of this note were generated with Scientist, clinical (histocompatibility and immunogenetics). Dictation errors may occur despite best attempts at proofreading.  Final Clinical Impression(s) / ED Diagnoses Final diagnoses:  Concern about STD in male without diagnosis    Rx / DC Orders ED Discharge Orders         Ordered    doxycycline (VIBRAMYCIN) 100 MG capsule  2 times daily        11/30/19 0939           Dietrich Pates, PA-C 11/30/19 0941    Rolan Bucco,  MD 11/30/19 1057

## 2019-11-30 NOTE — Discharge Instructions (Signed)
We will contact you with the results of your testing when it is available. Take the antibiotics for the entire course regardless of symptom improvement prevent worsening or recurrence of your infection. Return to the ER for you to experience worsening symptoms, abdominal pain, fever, testicle pain.

## 2019-11-30 NOTE — ED Triage Notes (Signed)
Patient reports to the ER for possible STD. Patient reports he was positive for Gonorrhea and Trich about 6 weeks ago and is worried he was re-infected. Patient denies urinary symptoms and reports a white d/c.

## 2019-12-01 LAB — GC/CHLAMYDIA PROBE AMP (~~LOC~~) NOT AT ARMC
Chlamydia: NEGATIVE
Comment: NEGATIVE
Comment: NORMAL
Neisseria Gonorrhea: POSITIVE — AB

## 2019-12-01 LAB — RPR: RPR Ser Ql: NONREACTIVE

## 2020-03-16 ENCOUNTER — Encounter (HOSPITAL_COMMUNITY): Payer: Self-pay | Admitting: Emergency Medicine

## 2020-03-16 ENCOUNTER — Emergency Department (HOSPITAL_COMMUNITY)
Admission: EM | Admit: 2020-03-16 | Discharge: 2020-03-16 | Disposition: A | Discharge status: Home / Self Care | Payer: Self-pay | Attending: Emergency Medicine | Admitting: Emergency Medicine

## 2020-03-16 ENCOUNTER — Other Ambulatory Visit: Payer: Self-pay

## 2020-03-16 DIAGNOSIS — R1032 Left lower quadrant pain: Secondary | ICD-10-CM | POA: Insufficient documentation

## 2020-03-16 DIAGNOSIS — Z5321 Procedure and treatment not carried out due to patient leaving prior to being seen by health care provider: Secondary | ICD-10-CM | POA: Insufficient documentation

## 2020-03-16 NOTE — ED Triage Notes (Signed)
Since last Thursday/ Friday patient has had a superficial lump on his abdomen, to the LLQ, slightly below his umbilicus, noted it growing. Tried hot compresses w/o relief. Redness and tender to palpation, about 1.5 inches in diameter, denies pus or drainage from the area.

## 2020-03-17 ENCOUNTER — Other Ambulatory Visit: Payer: Self-pay

## 2020-03-17 ENCOUNTER — Emergency Department (INDEPENDENT_AMBULATORY_CARE_PROVIDER_SITE_OTHER)
Admission: EM | Admit: 2020-03-17 | Discharge: 2020-03-17 | Disposition: A | Discharge status: Home / Self Care | Payer: Self-pay | Source: Home / Self Care

## 2020-03-17 DIAGNOSIS — L723 Sebaceous cyst: Secondary | ICD-10-CM

## 2020-03-17 MED ORDER — DOXYCYCLINE HYCLATE 100 MG PO CAPS: 100.0000 mg | ORAL_CAPSULE | Freq: Two times a day (BID) | ORAL | 0 refills | Status: AC

## 2020-03-17 NOTE — ED Provider Notes (Signed)
[facility]    CSN: [id number] Arrival date & time: 03/17/20  1141      History   Chief Complaint Chief Complaint  Patient presents with  . Abscess    HPI Justin Buchanan is a 40 y.o. male.   HPI  Patient presents today for evaluation of a possible abscess-like lesion on his lower abdomen.  Lesion has been present for about 5 days.  He reports increased tenderness and hardness of the area therefore he wanted to come in to be evaluated.  He has no history of recurrent abscess or cyst.  Past Medical History:  Diagnosis Date  . Asthma    childhood  . Pneumonia     There are no problems to display for this patient.   Past Surgical History:  Procedure Laterality Date  . CYSTECTOMY  1999       Home Medications    Prior to Admission medications   Medication Sig Start Date End Date Taking? Authorizing Provider  cyclopentolate (CYCLODRYL,CYCLOGYL) 1 % ophthalmic solution Place 1 drop into the left eye every 5 (five) minutes. 07/16/19   Darr, Gerilyn Pilgrim, PA-C  dextromethorphan 15 MG/5ML syrup Take 10 mLs (30 mg total) by mouth 4 (four) times daily as needed for cough. 03/05/12   Emilia Beck, PA-C  erythromycin ophthalmic ointment Place a 1/2 inch ribbon of ointment into the lower eyelid. 06/03/19   Lamptey, Britta Mccreedy, MD  olopatadine (PATANOL) 0.1 % ophthalmic solution Place 1 drop into both eyes 2 (two) times daily. 06/03/19   Lamptey, Britta Mccreedy, MD    Family History History reviewed. No pertinent family history.  Social History Social History   Tobacco Use  . Smoking status: Current Every Day Smoker    Packs/day: 0.50    Types: Cigarettes  . Smokeless tobacco: Never Used  Vaping Use  . Vaping Use: Never used  Substance Use Topics  . Alcohol use: Yes    Comment: occasional   . Drug use: No     Allergies   Patient has no known allergies.   Review of Systems Review of Systems Pertinent negatives listed in HPI   Physical Exam Triage Vital  Signs ED Triage Vitals  Enc Vitals Group     BP 03/17/20 1151 133/88     Pulse Rate 03/17/20 1151 74     Resp 03/17/20 1151 16     Temp 03/17/20 1151 97.9 F (36.6 C)     Temp Source 03/17/20 1151 Oral     SpO2 03/17/20 1151 98 %     Weight --      Height --      Head Circumference --      Peak Flow --      Pain Score 03/17/20 1153 4     Pain Loc --      Pain Edu? --      Excl. in GC? --    No data found.  Updated Vital Signs BP 133/88 (BP Location: Right Arm)   Pulse 74   Temp 97.9 F (36.6 C) (Oral)   Resp 16   SpO2 98%   Visual Acuity Right Eye Distance:   Left Eye Distance:   Bilateral Distance:    Right Eye Near:   Left Eye Near:    Bilateral Near:     Physical Exam Cardiovascular:     Rate and Rhythm: Normal rate and regular rhythm.  Pulmonary:     Effort: Pulmonary effort is normal.  Breath sounds: Normal breath sounds.  Abdominal:    Skin:    Capillary Refill: Capillary refill takes less than 2 seconds.  Neurological:     General: No focal deficit present.     Mental Status: He is alert.  Psychiatric:        Mood and Affect: Mood normal.        Behavior: Behavior normal.        Thought Content: Thought content normal.        Judgment: Judgment normal.      UC Treatments / Results  Labs (all labs ordered are listed, but only abnormal results are displayed) Labs Reviewed - No data to display  EKG   Radiology No results found.  Procedures Procedures (including critical care time)  Medications Ordered in UC Medications - No data to display  Initial Impression / Assessment and Plan / UC Course  I have reviewed the triage vital signs and the nursing notes.  Pertinent labs & imaging results that were available during my care of the patient were reviewed by me and considered in my medical decision making (see chart for details).    Evaluation of sebaceous cyst I&D not indicated as there is hardened induration without any  fluctuance.  Will trial a course of doxycycline twice daily as there is surrounding redness and tenderness with palpation.  Advised patient to continue warm compresses.  Discussed indication to return for possible I&D.  Advised if lesion completely resolved no additional follow-up is warranted.  Patient verbalized understanding and agreement with plan. Final Clinical Impressions(s) / UC Diagnoses   Final diagnoses:  Sebaceous cyst, abdominal wall     Discharge Instructions     Continue warm compress applications.  Take all medication as prescribed.  If you notice any drainage or increasing redness to area return for evaluation.    ED Prescriptions    Medication Sig Dispense Auth. Provider   doxycycline (VIBRAMYCIN) 100 MG capsule Take 1 capsule (100 mg total) by mouth 2 (two) times daily. 20 capsule Bing Neighbors, FNP     PDMP not reviewed this encounter.   Bing Neighbors, FNP 03/17/20 1242

## 2020-03-17 NOTE — ED Triage Notes (Signed)
Pt c/o boil type bump to the LT of his belly button. No current drainage. Never had boils before. Pain 4/10

## 2020-03-17 NOTE — Discharge Instructions (Signed)
Continue warm compress applications.  Take all medication as prescribed.  If you notice any drainage or increasing redness to area return for evaluation.

## 2021-04-25 ENCOUNTER — Encounter: Payer: Self-pay | Admitting: Emergency Medicine

## 2021-04-25 ENCOUNTER — Other Ambulatory Visit: Payer: Self-pay

## 2021-04-25 ENCOUNTER — Emergency Department
Admission: EM | Admit: 2021-04-25 | Discharge: 2021-04-25 | Disposition: A | Discharge status: Home / Self Care | Payer: Self-pay | Attending: Emergency Medicine | Admitting: Emergency Medicine

## 2021-04-25 DIAGNOSIS — A539 Syphilis, unspecified: Secondary | ICD-10-CM | POA: Insufficient documentation

## 2021-04-25 LAB — BASIC METABOLIC PANEL
Anion gap: 6 (ref 5–15)
BUN: 12 mg/dL (ref 6–20)
CO2: 27 mmol/L (ref 22–32)
Calcium: 9.3 mg/dL (ref 8.9–10.3)
Chloride: 104 mmol/L (ref 98–111)
Creatinine, Ser: 1.11 mg/dL (ref 0.61–1.24)
GFR, Estimated: >60 mL/min (ref >60)
Glucose, Bld: 86 mg/dL (ref 70–99)
Potassium: 3.8 mmol/L (ref 3.5–5.1)
Sodium: 137 mmol/L (ref 135–145)

## 2021-04-25 LAB — CHLAMYDIA/NGC RT PCR (ARMC ONLY)
Chlamydia Tr: NOT DETECTED
N gonorrhoeae: NOT DETECTED

## 2021-04-25 LAB — CBC WITH DIFFERENTIAL/PLATELET
Abs Immature Granulocytes: 0.01 10*3/uL (ref 0.00–0.07)
Basophils Absolute: 0 10*3/uL (ref 0.0–0.1)
Basophils Relative: 0 %
Eosinophils Absolute: 0.2 10*3/uL (ref 0.0–0.5)
Eosinophils Relative: 3 %
HCT: 42.6 % (ref 39.0–52.0)
Hemoglobin: 14.1 g/dL (ref 13.0–17.0)
Immature Granulocytes: 0 %
Lymphocytes Relative: 30 %
Lymphs Abs: 1.9 10*3/uL (ref 0.7–4.0)
MCH: 29.1 pg (ref 26.0–34.0)
MCHC: 33.1 g/dL (ref 30.0–36.0)
MCV: 87.8 fL (ref 80.0–100.0)
Monocytes Absolute: 0.6 10*3/uL (ref 0.1–1.0)
Monocytes Relative: 10 %
Neutro Abs: 3.6 10*3/uL (ref 1.7–7.7)
Neutrophils Relative %: 57 %
Platelets: 339 10*3/uL (ref 150–400)
RBC: 4.85 MIL/uL (ref 4.22–5.81)
RDW: 13.2 % (ref 11.5–15.5)
WBC: 6.3 10*3/uL (ref 4.0–10.5)
nRBC: 0 % (ref 0.0–0.2)

## 2021-04-25 MED ORDER — PENICILLIN G BENZATHINE 1200000 UNIT/2ML IM SUSY
2.4000 10*6.[IU] | PREFILLED_SYRINGE | Freq: Once | INTRAMUSCULAR | Status: AC
  Administered 2021-04-25: 2.4 10*6.[IU] via INTRAMUSCULAR
  Filled 2021-04-25: qty 4

## 2021-04-25 NOTE — ED Provider Notes (Signed)
[facility] Provider Note  Patient Contact: 6:17 PM (approximate)   History   Skin Issue   HPI  Justin Buchanan is a 41 y.o. male who presents to the emergency department complaining of a rash to the palms and soles of his feet.  Patient is unsure whether this may be attributed to the environment that he works in.  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  However patient is also concerned that he could have an STD.  He states that approximately 2 years ago he had an lesion on his penis that eventually went away after a few weeks.  It was nonpainful at the time.  I reviewed the patient's medical record and found this encounter and it looks like the patient did have a negative RPR at the time.  No recent lesions are identified.  Patient has no dysuria, polyuria, hematuria currently.  He has no oral lesions.  No fevers or chills, no URI symptoms.     Physical Exam   Triage Vital Signs: ED Triage Vitals  Enc Vitals Group     BP 04/25/21 1639 (!) 134/92     Pulse Rate 04/25/21 1639 87     Resp 04/25/21 1639 18     Temp 04/25/21 1639 98.5 F (36.9 C)     Temp src --      SpO2 04/25/21 1639 97 %     Weight 04/25/21 1638 175 lb (79.4 kg)     Height 04/25/21 1638 5\' 8"  (1.727 m)     Head Circumference --      Peak Flow --      Pain Score 04/25/21 1638 0     Pain Loc --      Pain Edu? --      Excl. in [place]? --     Most recent vital signs: Vitals:   04/25/21 1639  BP: (!) 134/92  Pulse: 87  Resp: 18  Temp: 98.5 F (36.9 C)  SpO2: 97%     General: Alert and in no acute distress. ENT:      Ears:       Nose: No congestion/rhinnorhea.      Mouth/Throat: Mucous membranes are moist.  No oral lesions Neck: No stridor. No cervical spine tenderness to palpation. Hematological/Lymphatic/Immunilogical: No cervical lymphadenopathy. Cardiovascular:  Good peripheral perfusion Respiratory: Normal respiratory effort without tachypnea or retractions.  Lungs CTAB. Good air entry to the bases with no decreased or absent breath sounds. Gastrointestinal: Bowel sounds 4 quadrants. Soft and nontender to palpation. No guarding or rigidity. No palpable masses. No distention. No CVA tenderness. Musculoskeletal: Full range of motion to all extremities.  Neurologic:  No gross focal neurologic deficits are appreciated.  Skin:   Visualization of the palm reveals dark flat lesions.  No cellulitic changes.  Visualization of the soles of the feet are consistent with same rash. Other:   ED Results / Procedures / Treatments   Labs (all labs ordered are listed, but only abnormal results are displayed) Labs Reviewed  CHLAMYDIA/NGC RT PCR ([facility] ONLY)            BASIC METABOLIC PANEL  CBC WITH DIFFERENTIAL/PLATELET  RPR     EKG     RADIOLOGY    No results found.  PROCEDURES:  Critical Care performed: No  Procedures   MEDICATIONS ORDERED IN ED: Medications  penicillin g benzathine (BICILLIN LA) 1200000 UNIT/2ML injection 2.4 Million Units (has no administration in time range)  IMPRESSION / MDM / ASSESSMENT AND PLAN / ED COURSE  I reviewed the triage vital signs and the nursing notes.                              Differential diagnosis includes, but is not limited to, syphilis, gonorrhea, hand-foot-and-mouth disease, contact dermatitis   Patient's diagnosis is consistent with syphilis.  Patient presented to the emergency department with lesions to hands and feet.  Patient had no other symptoms.  On questioning the patient roughly a year and a half ago he had a nonpainful penile lesion.  Evidently at the time there was concern that it was syphilis but reportedly patient had a negative RPR.  I cannot see that result.  Penile lesion went away, and he has returned with nonpruritic lesions to both hands and feet.  No associated symptoms to be concern for hand-foot-and-mouth disease.  Does not appear to be contact dermatitis.  RPR is a  send out and I do not have results tonight though given the history and clinical presentation I am concerned for syphilis and will treat as such.  Patient will receive 2,400,000 units of penicillin G..  Follow-up primary care as needed.  Return to the ED precautions are discussed with the patient.  Patient is given ED precautions to return to the ED for any worsening or new symptoms.        FINAL CLINICAL IMPRESSION(S) / ED DIAGNOSES   Final diagnoses:  Syphilis     Rx / DC Orders   ED Discharge Orders     None        Note:  This document was prepared using Dragon voice recognition software and may include unintentional dictation errors.   Brynda Peon 04/25/21 2115    Nance Pear, MD 04/25/21 2233

## 2021-04-25 NOTE — ED Triage Notes (Signed)
Pt via POV from home. 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 Denies pain or itching. Pt is A&OX4 and NAD

## 2021-04-25 NOTE — ED Provider Triage Note (Signed)
°  Emergency Medicine Provider Triage Evaluation Note  Justin Buchanan , a 40 y.o.male,  was evaluated in triage.  Pt complains of skin rash.  Patient reported lesions on the palms of his hands and the soles of his feet.  Reports mild itchiness, no pain.  No bleeding or discharge.  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  This may be a cause.  He is also sexually active.   Review of Systems  Positive: Rashes/lesions. Negative: Denies fever, chest pain, vomiting  Physical Exam   Vitals:   04/25/21 1639  BP: (!) 134/92  Pulse: 87  Resp: 18  Temp: 98.5 F (36.9 C)  SpO2: 97%   Gen:   Awake, no distress   Resp:  Normal effort  MSK:   Moves extremities without difficulty  Other:    Medical Decision Making  Given the patient's initial medical screening exam, the following diagnostic evaluation has been ordered. The patient will be placed in the appropriate treatment space, once one is available, to complete the evaluation and treatment. I have discussed the plan of care with the patient and I have advised the patient that an ED physician or mid-level practitioner will reevaluate their condition after the test results have been received, as the results may give them additional insight into the type of treatment they may need.    Diagnostics: Labs, gonorrhea/chlamydia, RPR  Treatments: none immediately   Varney Daily, [place] 04/25/21 1647

## 2021-04-26 LAB — RPR
RPR Ser Ql: REACTIVE — AB
RPR Titer: 1:64 {titer}

## 2021-04-27 ENCOUNTER — Telehealth: Payer: Self-pay | Admitting: Emergency Medicine

## 2021-04-27 LAB — T.PALLIDUM AB, TOTAL: T Pallidum Abs: REACTIVE — AB

## 2021-05-31 ENCOUNTER — Emergency Department: Payer: Self-pay

## 2021-05-31 ENCOUNTER — Other Ambulatory Visit: Payer: Self-pay

## 2021-05-31 ENCOUNTER — Emergency Department
Admission: EM | Admit: 2021-05-31 | Discharge: 2021-05-31 | Disposition: A | Discharge status: Home / Self Care | Payer: Self-pay | Attending: Emergency Medicine | Admitting: Emergency Medicine

## 2021-05-31 DIAGNOSIS — J45909 Unspecified asthma, uncomplicated: Secondary | ICD-10-CM | POA: Insufficient documentation

## 2021-05-31 DIAGNOSIS — G43809 Other migraine, not intractable, without status migrainosus: Secondary | ICD-10-CM | POA: Insufficient documentation

## 2021-05-31 LAB — CBC
HCT: 42.8 % (ref 39.0–52.0)
Hemoglobin: 14.4 g/dL (ref 13.0–17.0)
MCH: 29.6 pg (ref 26.0–34.0)
MCHC: 33.6 g/dL (ref 30.0–36.0)
MCV: 87.9 fL (ref 80.0–100.0)
Platelets: 278 10*3/uL (ref 150–400)
RBC: 4.87 MIL/uL (ref 4.22–5.81)
RDW: 13.1 % (ref 11.5–15.5)
WBC: 6.2 10*3/uL (ref 4.0–10.5)
nRBC: 0 % (ref 0.0–0.2)

## 2021-05-31 LAB — BASIC METABOLIC PANEL
Anion gap: 9 (ref 5–15)
BUN: 12 mg/dL (ref 6–20)
CO2: 24 mmol/L (ref 22–32)
Calcium: 9.5 mg/dL (ref 8.9–10.3)
Chloride: 106 mmol/L (ref 98–111)
Creatinine, Ser: 1.04 mg/dL (ref 0.61–1.24)
GFR, Estimated: >60 mL/min (ref >60)
Glucose, Bld: 90 mg/dL (ref 70–99)
Potassium: 3.8 mmol/L (ref 3.5–5.1)
Sodium: 139 mmol/L (ref 135–145)

## 2021-05-31 MED ORDER — BUTALBITAL-APAP-CAFFEINE 50-325-40 MG PO TABS: 1.0000 | ORAL_TABLET | Freq: Four times a day (QID) | ORAL | 0 refills | Status: AC | PRN

## 2021-05-31 NOTE — ED Provider Notes (Signed)
? ?[facility] ?Provider Note ? ? Event Date/Time  ? First MD Initiated Contact with Patient 05/31/21 2140   ?  (approximate) ?History  ?Headache ? ?HPI ?Justin Buchanan is a 41 y.o. male with a stated past medical history of asthma who presents for 1 week of left-sided throbbing headache that radiates down into the left jaw and is associated with photophobia.  Patient states that these headaches come and go but he has had 1 every day for the last week of varying intensity with the worst being a 7/10.  Patient has tried over-the-counter analgesic medications such as NSAIDs and Tylenol that have helped this pain but it has not gone away and stayed away.  Patient denies any nausea/vomiting, weakness/numbness/paresthesias in any extremity, chest pain, shortness of breath, abdominal pain, or dysuria ?Physical Exam  ?Triage Vital Signs: ?ED Triage Vitals  ?Enc Vitals Group  ?   BP 05/31/21 1948 (!) 145/71  ?   Pulse Rate 05/31/21 1948 82  ?   Resp 05/31/21 1948 18  ?   Temp 05/31/21 1948 98.2 ?F (36.8 ?C)  ?   Temp Source 05/31/21 1948 Oral  ?   SpO2 05/31/21 1948 99 %  ?   Weight 05/31/21 1949 175 lb (79.4 kg)  ?   Height 05/31/21 1949 5\' 8"  ([id number] m)  ?   Head Circumference --   ?   Peak Flow --   ?   Pain Score 05/31/21 1949 7  ?   Pain Loc --   ?   Pain Edu? --   ?   Excl. in [place]? --   ? ?Most recent vital signs: ?Vitals:  ? 05/31/21 1948 05/31/21 2116  ?BP: (!) 145/71 124/81  ?Pulse: 82 64  ?Resp: 18 16  ?Temp: 98.2 ?F (36.8 ?C)   ?SpO2: 99% 99%  ? ?General: Awake, oriented x4. ?CV:  Good peripheral perfusion.  ?Resp:  Normal effort.  ?Abd:  No distention.  ?Other:  Middle-aged African-American male sitting in bed in no distress.  NIHSS 0 ?ED Results / Procedures / Treatments  ?Labs ?(all labs ordered are listed, but only abnormal results are displayed) ?Labs Reviewed  ?BASIC METABOLIC PANEL  ?CBC  ? ?RADIOLOGY ?ED MD interpretation: CT of the head without contrast interpreted by me shows no  evidence of acute abnormalities including no intracerebral hemorrhage, obvious masses, or significant edema ?-Agree with radiology assessment ?Official radiology report(s): ?CT Head Wo Contrast ? ?Result Date: 05/31/2021 ?A CLINICAL DATA: 41 year old male per dense for evaluation of chronic headache. EXAM: CT HEAD WITHOUT CONTRAST TECHNIQUE: Contiguous axial images were obtained from the base of the skull through the vertex without intravenous contrast. RADIATION DOSE REDUCTION: This exam was performed according to the departmental dose-optimization program which includes automated exposure control, adjustment of the mA and/or kV according to patient size and/or use of iterative reconstruction technique. COMPARISON:  None FINDINGS: Brain: No evidence of acute infarction, hemorrhage, hydrocephalus, extra-axial collection or mass lesion/mass effect. Vascular: No hyperdense vessel or unexpected calcification. Skull: Normal. Negative for fracture or focal lesion. Sinuses/Orbits: Visualized paranasal sinuses and orbits without acute process. Other: None IMPRESSION: No acute intracranial process. Electronically Signed   By: Zetta Bills M.D.   On: 05/31/2021 21:10   ?PROCEDURES: ?Critical Care performed: No ?Procedures ?MEDICATIONS ORDERED IN ED: ?Medications - No data to display ?IMPRESSION / MDM / ASSESSMENT AND PLAN / ED COURSE  ?I reviewed the triage vital signs and the nursing notes. ?             ?               ? ?  41 year old male presents with Headache. ? ?No focal neurological symptoms. Neuro exam is benign. Pt is nontoxic. VSS. ? ?Based on history and normal neurological exam I have low suspicion for intracranial tumor, intracranial bleed, meningitis, temporal arteritis, glaucoma, CO poisoning. ? ?Most likely patient has benign headache, recommend rest, hydration, and ibuprofen. ? ?Disposition: Discharge home with strict return precautions and instructions for prompt primary care follow up. ? ?  ?FINAL CLINICAL  IMPRESSION(S) / ED DIAGNOSES  ? ?Final diagnoses:  ?Other migraine without status migrainosus, not intractable  ? ?Rx / DC Orders  ? ?ED Discharge Orders   ? ?      Ordered  ?  butalbital-acetaminophen-caffeine (FIORICET) 50-325-40 MG tablet  Every 6 hours PRN       ? 05/31/21 2159  ? ?  ?  ? ?  ? ?Note:  This document was prepared using Dragon voice recognition software and may include unintentional dictation errors. ?  ?Naaman Plummer, MD ?05/31/21 2247 ? ?

## 2021-05-31 NOTE — ED Triage Notes (Signed)
Reports recurrent headaches x 1 week. Left side of head but feels like it radiates to left jaw. Reports light sensitivity when pain is at its worst. Denies vision changes, confusion. No neuro deficit noted.  ?

## 2021-10-24 ENCOUNTER — Other Ambulatory Visit: Payer: Self-pay

## 2021-10-24 DIAGNOSIS — Z5321 Procedure and treatment not carried out due to patient leaving prior to being seen by health care provider: Secondary | ICD-10-CM | POA: Insufficient documentation

## 2021-10-24 DIAGNOSIS — R531 Weakness: Secondary | ICD-10-CM | POA: Insufficient documentation

## 2021-10-24 NOTE — ED Triage Notes (Signed)
Pt presents via POV c/o decreased "energy" for the past few days. Also reports some eye drainage over the last few months per pt report. Pt denies pain. Ambulatory to triage.

## 2021-10-24 NOTE — ED Triage Notes (Signed)
First Nurse Note:  Arrives with multiple complaints that have been ongoing x 2 months.  Patient is AAOx3.  Skin warm and dry. MAE equally and strong. Gait steady. NAD

## 2021-10-25 ENCOUNTER — Emergency Department
Admission: EM | Admit: 2021-10-25 | Discharge: 2021-10-25 | Discharge status: Against Medical Advice | Payer: Self-pay | Attending: Emergency Medicine | Admitting: Emergency Medicine

## 2021-10-25 NOTE — ED Notes (Signed)
No answer when called several times from lobby 

## 2023-09-22 IMAGING — CT CT HEAD W/O CM
4 series · 16 of 47 positions shown, 18 images · non-contrast
Comparison: None

A CLINICAL DATA:
40-year-old male per dense for evaluation of chronic headache.



[Series 2: head wo · axial · 0.41mm/px · z∈[-70,+50]mm · 7 of 33 slices shown, 9 images]
[im 5/33  brain]
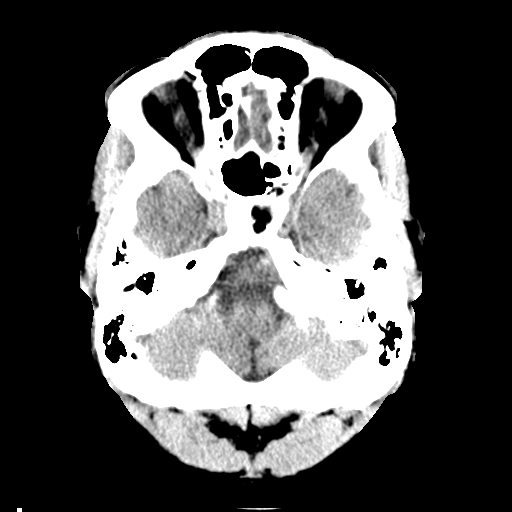
[im 5/33  bone]
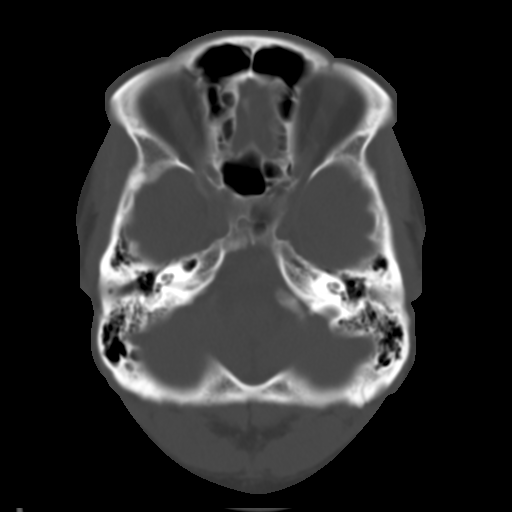
[im 9/33  brain]
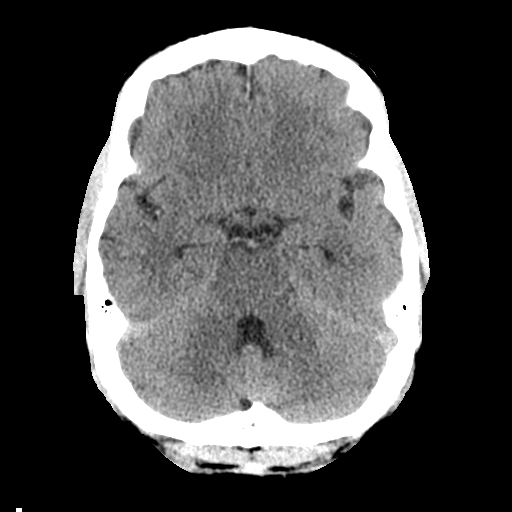
[im 13/33  brain]
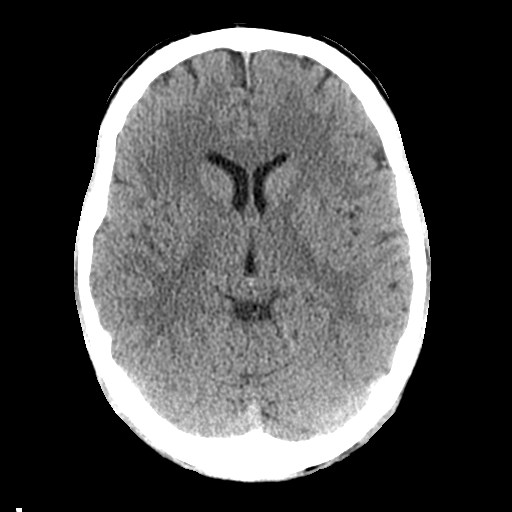
[im 17/33  brain]
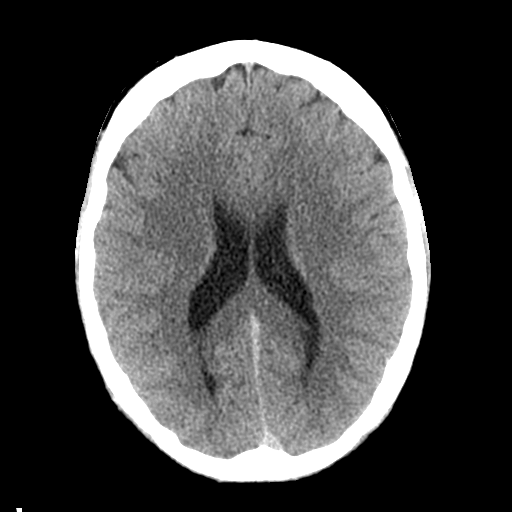
[im 21/33  brain]
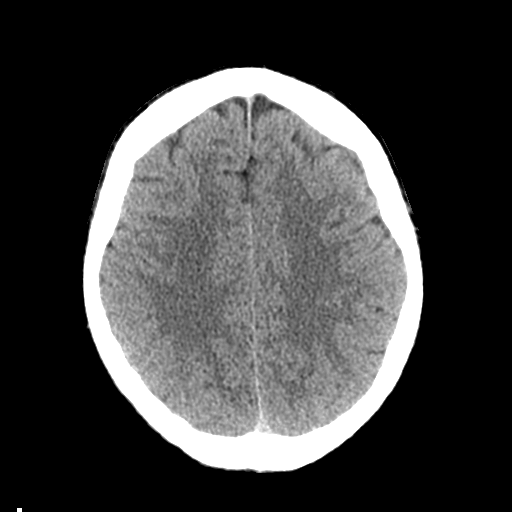
[im 21/33  bone]
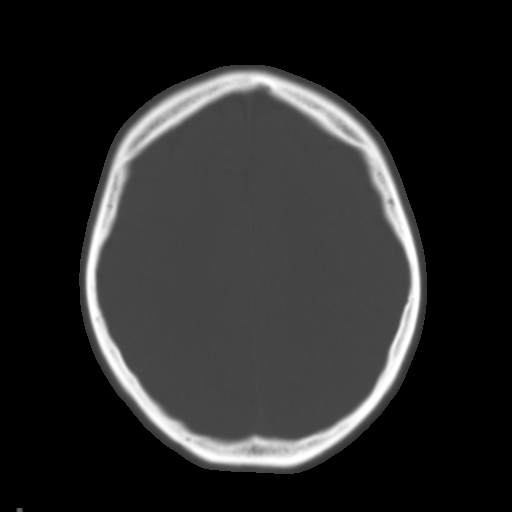
[im 25/33  brain]
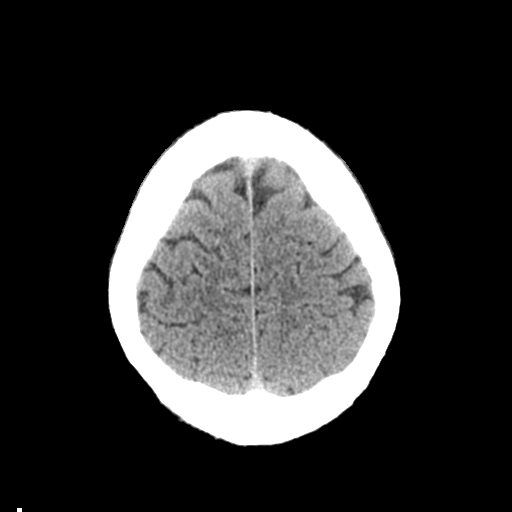
[im 29/33  brain]
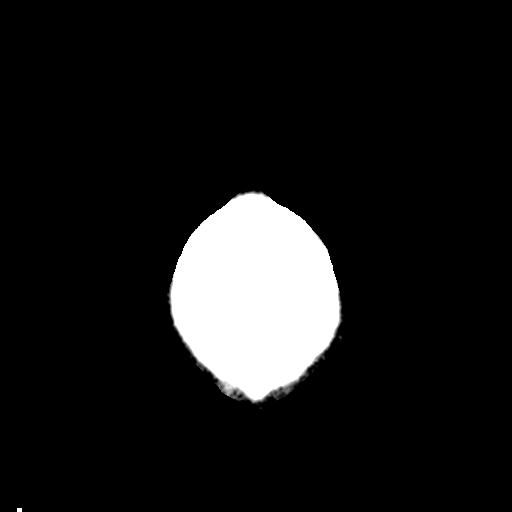

[Series 3: head bone · axial · 0.41mm/px · z∈[-74,-42]mm · 3 of 82 slices shown]
[im 9/82  bone]
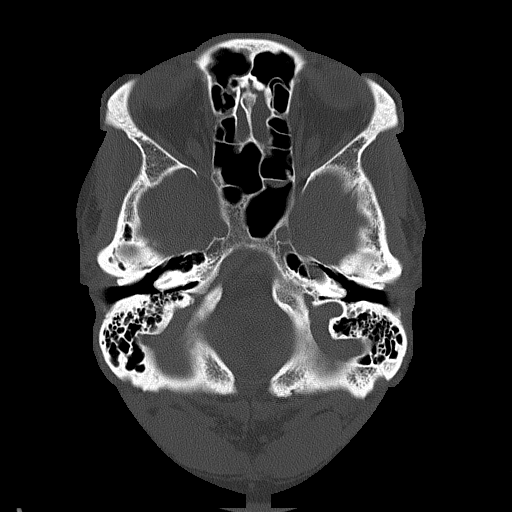
[im 17/82  bone]
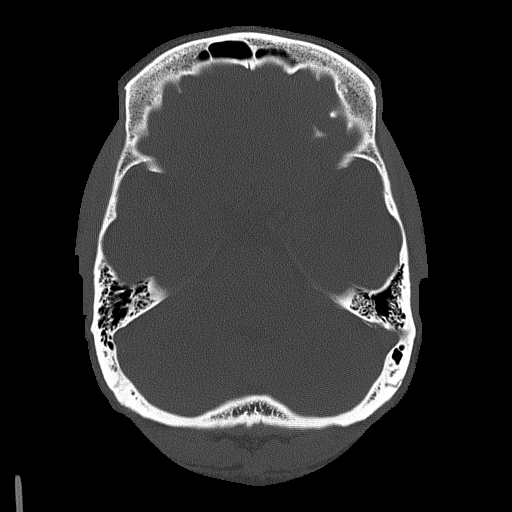
[im 25/82  bone]
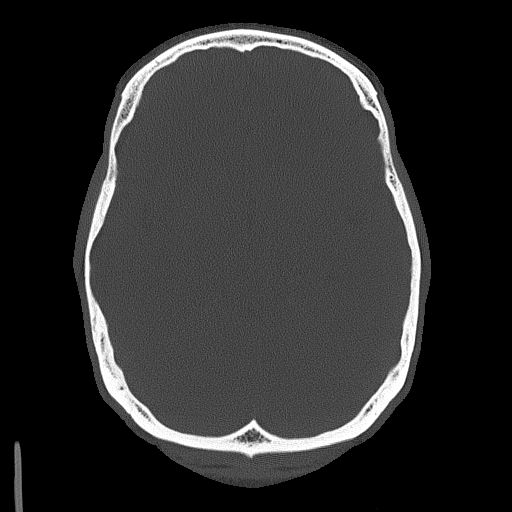

[Series 4: coronal soft tissue · coronal · 0.33mm/px · 3 of 67 slices shown]
[im 23/67  brain]
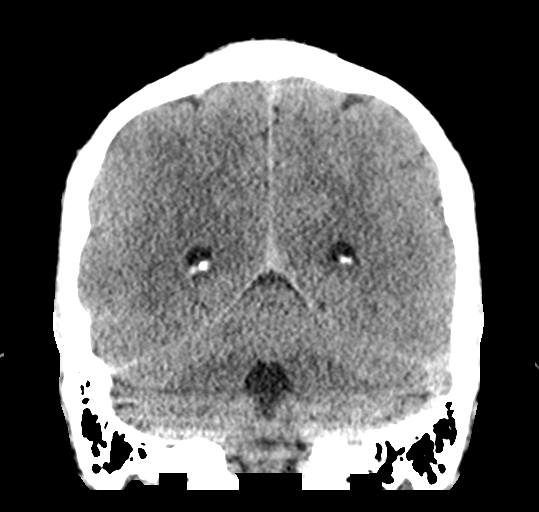
[im 30/67  brain]
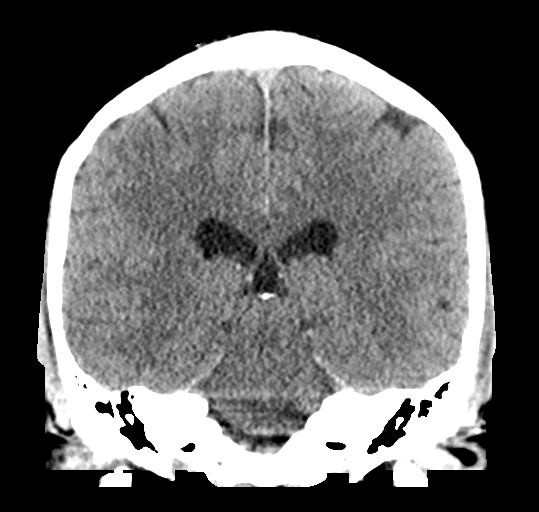
[im 37/67  brain]
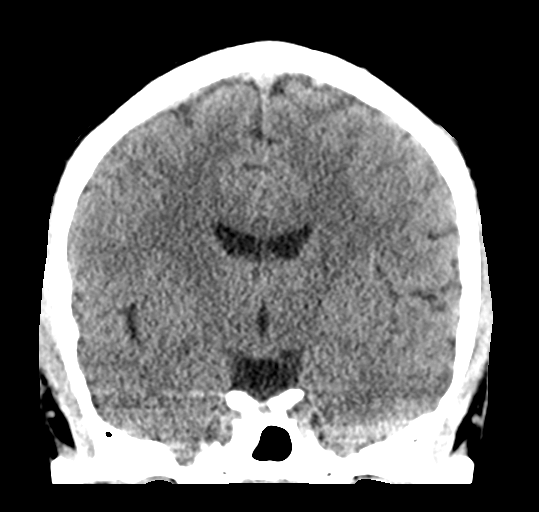

[Series 5: sagittal soft tissue · sagittal · 0.33mm/px · 3 of 58 slices shown]
[im 20/58  brain]
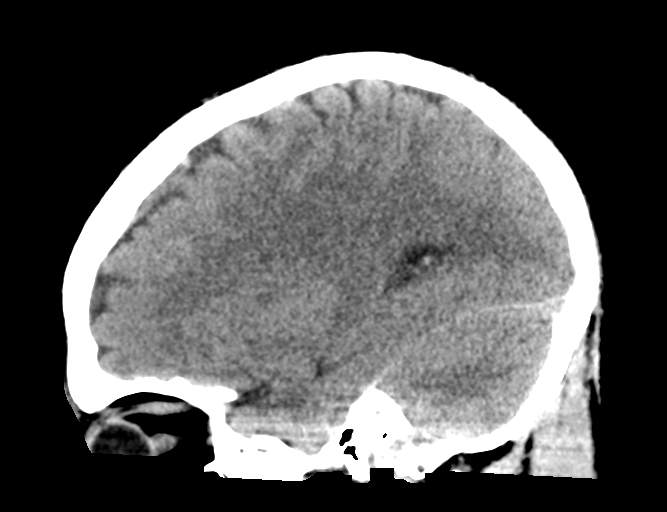
[im 29/58  brain]
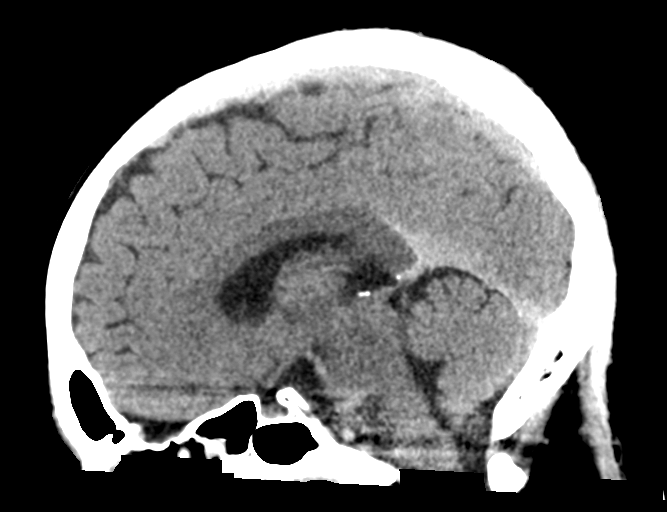
[im 39/58  brain]
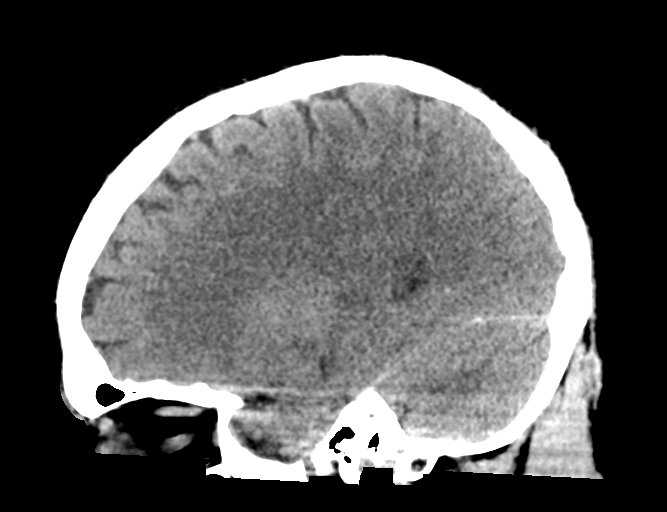

[16 of 47 positions shown; findings below may reference images not displayed]

FINDINGS: Brain: No evidence of acute infarction, hemorrhage, hydrocephalus,
extra-axial collection or mass lesion/mass effect.

Vascular: No hyperdense vessel or unexpected calcification.

Skull: Normal. Negative for fracture or focal lesion.

Sinuses/Orbits: Visualized paranasal sinuses and orbits without
acute process.

Other: None
IMPRESSION: No acute intracranial process.
